# Patient Record
Sex: Male | Born: 1980 | Race: Black or African American | Hispanic: No | Marital: Single | Smoking: Current every day smoker
Health system: Southern US, Community
[De-identification: ages and names within clinical notes are randomized; demographics above are authoritative.]

## PROBLEM LIST (undated history)

## (undated) DIAGNOSIS — Z9119 Patient's noncompliance with other medical treatment and regimen: Secondary | ICD-10-CM

## (undated) DIAGNOSIS — Z91199 Patient's noncompliance with other medical treatment and regimen due to unspecified reason: Secondary | ICD-10-CM

## (undated) DIAGNOSIS — E109 Type 1 diabetes mellitus without complications: Secondary | ICD-10-CM

## (undated) DIAGNOSIS — R030 Elevated blood-pressure reading, without diagnosis of hypertension: Secondary | ICD-10-CM

## (undated) DIAGNOSIS — N529 Male erectile dysfunction, unspecified: Secondary | ICD-10-CM

## (undated) HISTORY — DX: Type 1 diabetes mellitus without complications: E10.9

## (undated) HISTORY — DX: Elevated blood-pressure reading, without diagnosis of hypertension: R03.0

## (undated) HISTORY — DX: Male erectile dysfunction, unspecified: N52.9

---

## 2002-11-27 ENCOUNTER — Emergency Department (HOSPITAL_COMMUNITY): Admission: EM | Admit: 2002-11-27 | Discharge: 2002-11-27 | Payer: Self-pay | Admitting: Emergency Medicine

## 2002-12-01 ENCOUNTER — Emergency Department (HOSPITAL_COMMUNITY): Admission: EM | Admit: 2002-12-01 | Discharge: 2002-12-02 | Payer: Self-pay | Admitting: Emergency Medicine

## 2003-03-28 ENCOUNTER — Emergency Department (HOSPITAL_COMMUNITY): Admission: EM | Admit: 2003-03-28 | Discharge: 2003-03-28 | Payer: Self-pay | Admitting: Emergency Medicine

## 2003-03-28 ENCOUNTER — Encounter: Payer: Self-pay | Admitting: Emergency Medicine

## 2003-07-13 ENCOUNTER — Emergency Department (HOSPITAL_COMMUNITY): Admission: EM | Admit: 2003-07-13 | Discharge: 2003-07-14 | Payer: Self-pay | Admitting: Emergency Medicine

## 2003-10-13 ENCOUNTER — Emergency Department (HOSPITAL_COMMUNITY): Admission: EM | Admit: 2003-10-13 | Discharge: 2003-10-13 | Payer: Self-pay | Admitting: Emergency Medicine

## 2003-11-16 ENCOUNTER — Encounter: Admission: RE | Admit: 2003-11-16 | Discharge: 2004-02-14 | Payer: Self-pay | Admitting: Geriatric Medicine

## 2003-12-03 ENCOUNTER — Emergency Department (HOSPITAL_COMMUNITY): Admission: EM | Admit: 2003-12-03 | Discharge: 2003-12-03 | Payer: Self-pay | Admitting: Emergency Medicine

## 2004-04-29 ENCOUNTER — Emergency Department (HOSPITAL_COMMUNITY): Admission: EM | Admit: 2004-04-29 | Discharge: 2004-04-29 | Payer: Self-pay | Admitting: Emergency Medicine

## 2005-01-12 ENCOUNTER — Emergency Department (HOSPITAL_COMMUNITY): Admission: EM | Admit: 2005-01-12 | Discharge: 2005-01-12 | Payer: Self-pay | Admitting: Emergency Medicine

## 2006-02-17 ENCOUNTER — Emergency Department (HOSPITAL_COMMUNITY): Admission: EM | Admit: 2006-02-17 | Discharge: 2006-02-17 | Payer: Self-pay | Admitting: Emergency Medicine

## 2006-06-07 ENCOUNTER — Emergency Department (HOSPITAL_COMMUNITY): Admission: EM | Admit: 2006-06-07 | Discharge: 2006-06-07 | Payer: Self-pay | Admitting: Emergency Medicine

## 2007-04-18 ENCOUNTER — Emergency Department (HOSPITAL_COMMUNITY): Admission: EM | Admit: 2007-04-18 | Discharge: 2007-04-19 | Payer: Self-pay | Admitting: Emergency Medicine

## 2007-05-10 ENCOUNTER — Emergency Department (HOSPITAL_COMMUNITY): Admission: EM | Admit: 2007-05-10 | Discharge: 2007-05-10 | Payer: Self-pay | Admitting: Emergency Medicine

## 2007-12-31 ENCOUNTER — Emergency Department (HOSPITAL_COMMUNITY): Admission: EM | Admit: 2007-12-31 | Discharge: 2007-12-31 | Payer: Self-pay | Admitting: Emergency Medicine

## 2008-07-06 ENCOUNTER — Emergency Department (HOSPITAL_COMMUNITY): Admission: EM | Admit: 2008-07-06 | Discharge: 2008-07-06 | Payer: Self-pay | Admitting: Emergency Medicine

## 2008-10-06 ENCOUNTER — Emergency Department (HOSPITAL_COMMUNITY): Admission: EM | Admit: 2008-10-06 | Discharge: 2008-10-06 | Payer: Self-pay | Admitting: Emergency Medicine

## 2009-03-01 ENCOUNTER — Emergency Department (HOSPITAL_COMMUNITY): Admission: EM | Admit: 2009-03-01 | Discharge: 2009-03-01 | Payer: Self-pay | Admitting: Family Medicine

## 2009-03-04 ENCOUNTER — Emergency Department (HOSPITAL_COMMUNITY): Admission: EM | Admit: 2009-03-04 | Discharge: 2009-03-04 | Payer: Self-pay | Admitting: Family Medicine

## 2009-03-20 ENCOUNTER — Ambulatory Visit: Payer: Self-pay | Admitting: Internal Medicine

## 2009-03-20 DIAGNOSIS — N529 Male erectile dysfunction, unspecified: Secondary | ICD-10-CM

## 2009-03-20 DIAGNOSIS — E109 Type 1 diabetes mellitus without complications: Secondary | ICD-10-CM

## 2009-03-20 DIAGNOSIS — R030 Elevated blood-pressure reading, without diagnosis of hypertension: Secondary | ICD-10-CM | POA: Insufficient documentation

## 2009-03-20 HISTORY — DX: Male erectile dysfunction, unspecified: N52.9

## 2009-03-20 HISTORY — DX: Type 1 diabetes mellitus without complications: E10.9

## 2009-03-20 HISTORY — DX: Elevated blood-pressure reading, without diagnosis of hypertension: R03.0

## 2009-03-21 ENCOUNTER — Encounter: Payer: Self-pay | Admitting: Internal Medicine

## 2009-03-21 LAB — CONVERTED CEMR LAB
AST: 17 units/L (ref 0–37)
Albumin: 4.2 g/dL (ref 3.5–5.2)
BUN: 16 mg/dL (ref 6–23)
CO2: 29 meq/L (ref 19–32)
Calcium: 9.5 mg/dL (ref 8.4–10.5)
Chloride: 106 meq/L (ref 96–112)
Creatinine, Ser: 0.9 mg/dL (ref 0.4–1.5)
Eosinophils Absolute: 0.1 10*3/uL (ref 0.0–0.7)
GFR calc non Af Amer: 131.44 mL/min (ref >60)
Glucose, Bld: 230 mg/dL — ABNORMAL HIGH (ref 70–99)
Ketones, ur: NEGATIVE mg/dL
LDL Cholesterol: 77 mg/dL (ref 0–99)
Leukocytes, UA: NEGATIVE
Lymphocytes Relative: 32.4 % (ref 12.0–46.0)
MCHC: 34.5 g/dL (ref 30.0–36.0)
Microalb, Ur: 0.9 mg/dL (ref 0.0–1.9)
Monocytes Absolute: 0.7 10*3/uL (ref 0.1–1.0)
Neutro Abs: 5.4 10*3/uL (ref 1.4–7.7)
Neutrophils Relative %: 58.6 % (ref 43.0–77.0)
Platelets: 246 10*3/uL (ref 150.0–400.0)
Potassium: 4.2 meq/L (ref 3.5–5.1)
RBC: 5.92 M/uL — ABNORMAL HIGH (ref 4.22–5.81)
RDW: 12.1 % (ref 11.5–14.6)
Sodium: 141 meq/L (ref 135–145)
Specific Gravity, Urine: >=1.03 (ref 1.000–1.030)
Testosterone: 368.87 ng/dL (ref 350.00–890.00)
Total Bilirubin: 1.1 mg/dL (ref 0.3–1.2)
Urine Glucose: 100 mg/dL
Urobilinogen, UA: 2 (ref 0.0–1.0)
VLDL: 11.2 mg/dL (ref 0.0–40.0)

## 2010-03-15 ENCOUNTER — Emergency Department (HOSPITAL_COMMUNITY): Admission: EM | Admit: 2010-03-15 | Discharge: 2010-03-16 | Payer: Self-pay | Admitting: Emergency Medicine

## 2010-08-11 IMAGING — CR DG CHEST 2V
2 series · 2 of 2 positions shown · non-contrast
Comparison: 02/17/2006

CLINICAL DATA: Hyperglycemia

CHEST - 2 VIEW

[w chest pa]
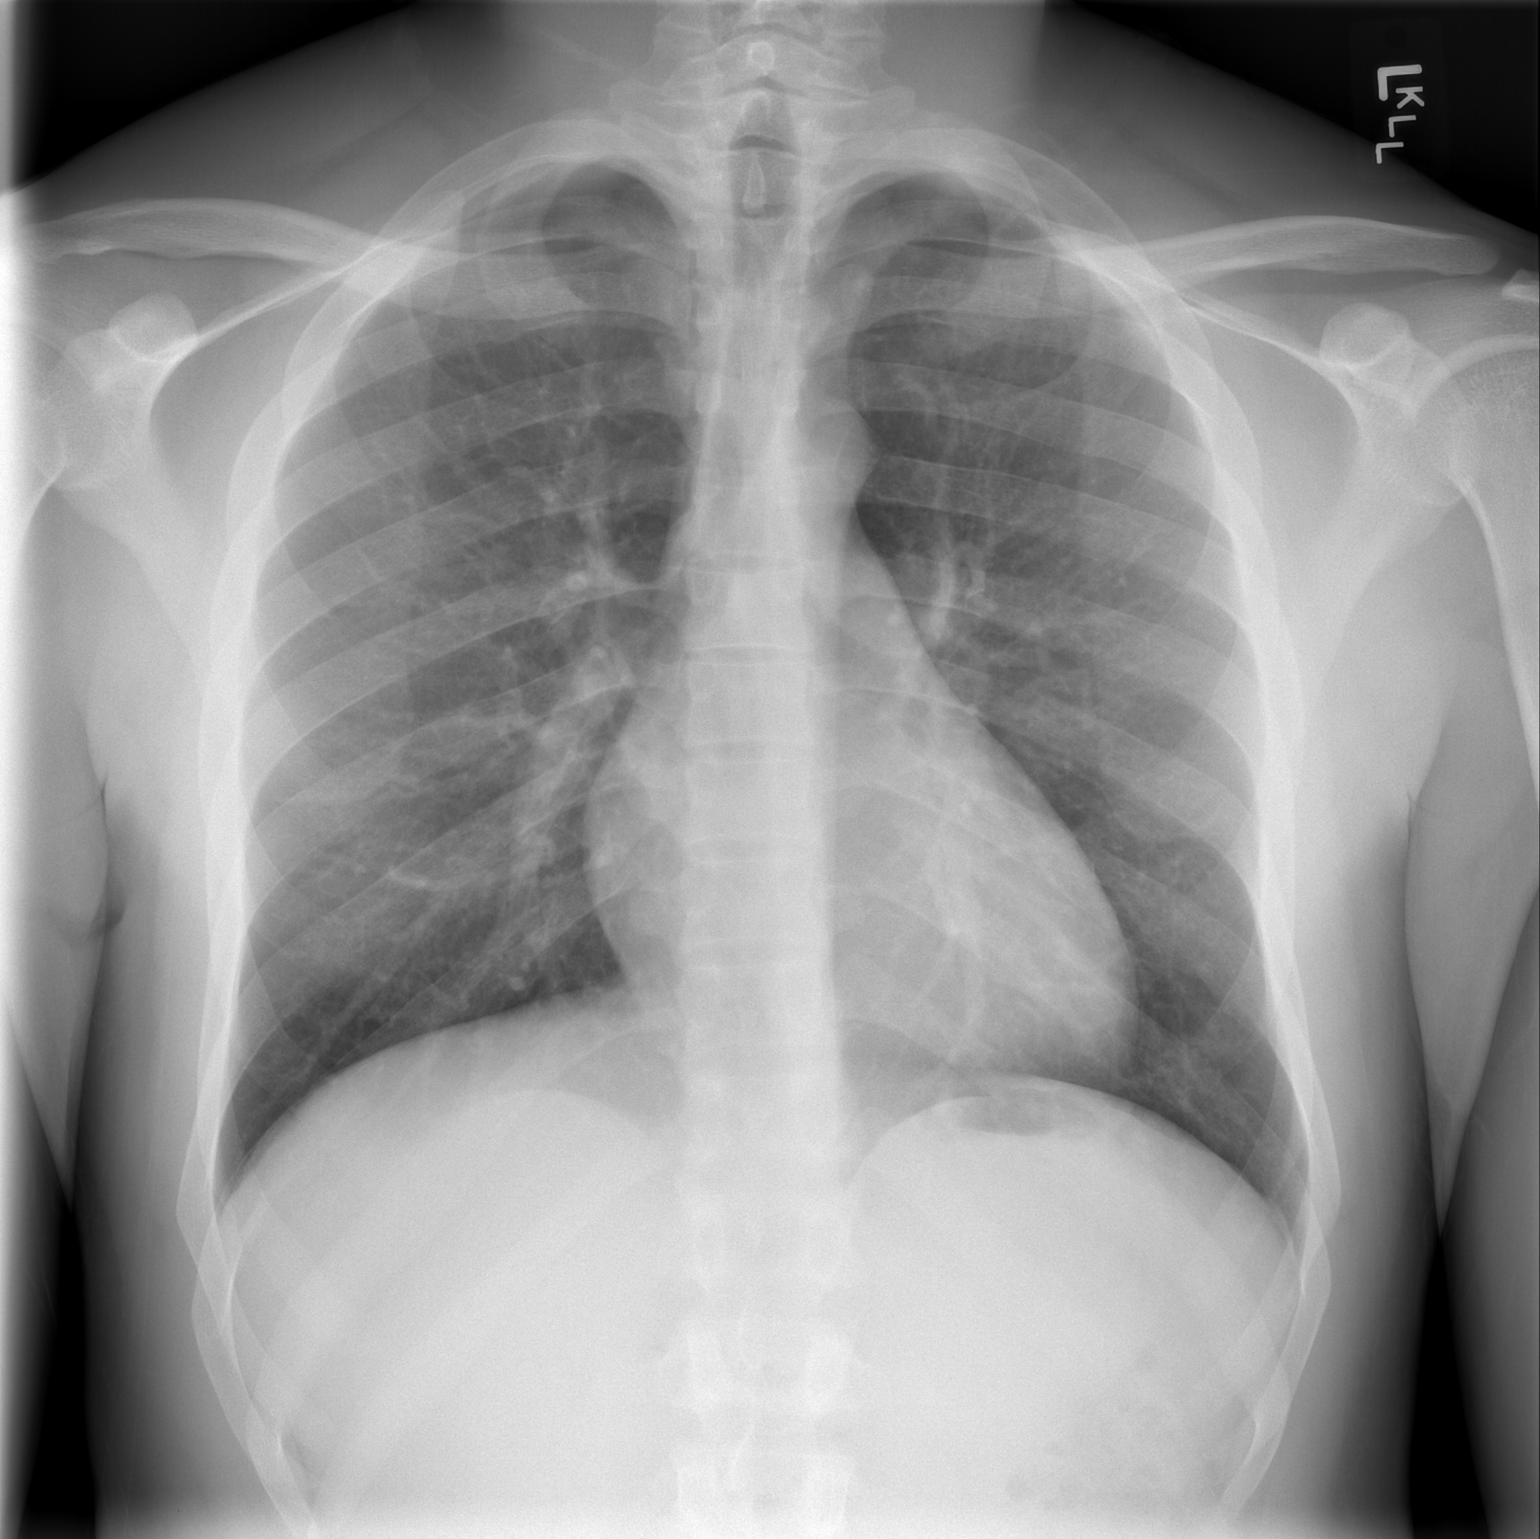

[w chest lat]
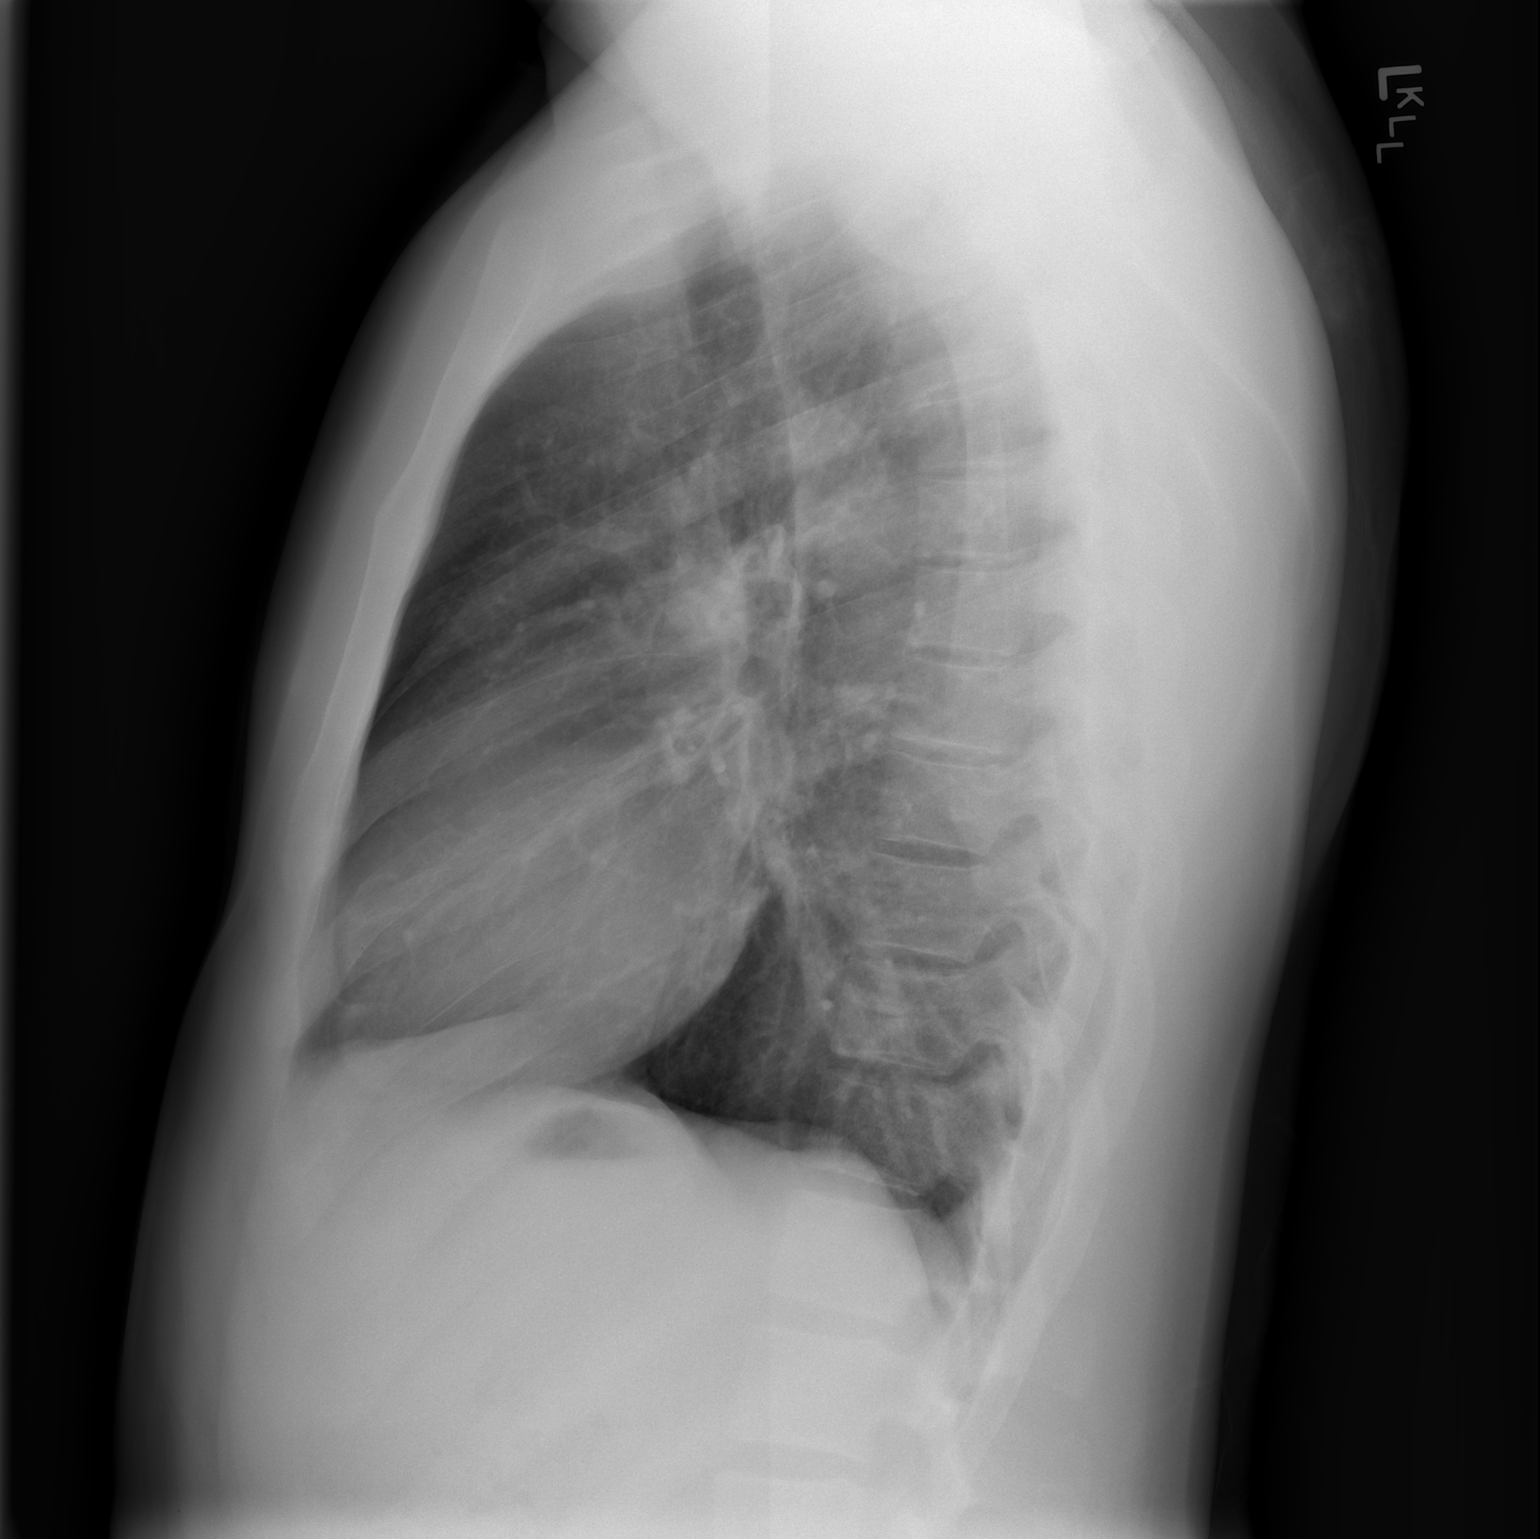

[2 of 2 positions shown; findings below may reference images not displayed]

FINDINGS: Cardiomediastinal silhouette is stable.  No acute
infiltrate or pleural effusion.  No pulmonary edema.  Bony thorax
is stable.
IMPRESSION: No active disease.

## 2010-08-24 ENCOUNTER — Ambulatory Visit
Admission: RE | Admit: 2010-08-24 | Discharge: 2010-08-24 | Payer: Self-pay | Source: Home / Self Care | Attending: Endocrinology | Admitting: Endocrinology

## 2010-08-24 ENCOUNTER — Other Ambulatory Visit: Payer: Self-pay | Admitting: Endocrinology

## 2010-08-24 DIAGNOSIS — R9389 Abnormal findings on diagnostic imaging of other specified body structures: Secondary | ICD-10-CM | POA: Insufficient documentation

## 2010-08-24 LAB — LIPID PANEL
Cholesterol: 145 mg/dL (ref 0–200)
HDL: 34.5 mg/dL — ABNORMAL LOW (ref >39.00)
LDL Cholesterol: 101 mg/dL — ABNORMAL HIGH (ref 0–99)
Total CHOL/HDL Ratio: 4
Triglycerides: 47 mg/dL (ref 0.0–149.0)
VLDL: 9.4 mg/dL (ref 0.0–40.0)

## 2010-08-24 LAB — BASIC METABOLIC PANEL
BUN: 14 mg/dL (ref 6–23)
CO2: 29 mEq/L (ref 19–32)
Calcium: 9.6 mg/dL (ref 8.4–10.5)
Chloride: 100 mEq/L (ref 96–112)
Creatinine, Ser: 1.1 mg/dL (ref 0.4–1.5)
GFR: 107.63 mL/min (ref >60.00)
Glucose, Bld: 276 mg/dL — ABNORMAL HIGH (ref 70–99)
Potassium: 4.4 mEq/L (ref 3.5–5.1)
Sodium: 138 mEq/L (ref 135–145)

## 2010-08-24 LAB — MICROALBUMIN / CREATININE URINE RATIO
Creatinine,U: 194.9 mg/dL
Microalb Creat Ratio: 0.3 mg/g (ref 0.0–30.0)
Microalb, Ur: 0.5 mg/dL (ref 0.0–1.9)

## 2010-08-24 LAB — TSH: TSH: 0.77 u[IU]/mL (ref 0.35–5.50)

## 2010-08-24 LAB — HEMOGLOBIN A1C: Hgb A1c MFr Bld: 11.1 % — ABNORMAL HIGH (ref 4.6–6.5)

## 2010-09-07 ENCOUNTER — Ambulatory Visit
Admission: RE | Admit: 2010-09-07 | Discharge: 2010-09-07 | Payer: Self-pay | Source: Home / Self Care | Attending: Endocrinology | Admitting: Endocrinology

## 2010-09-07 LAB — CONVERTED CEMR LAB: Blood Glucose, Fingerstick: 231

## 2010-09-12 ENCOUNTER — Encounter: Payer: Self-pay | Admitting: Endocrinology

## 2010-09-13 NOTE — Assessment & Plan Note (Signed)
Summary: NEW ENDO/DM/BCBS/LB   Vital Signs:  Patient profile:   30 year old male Height:      71 inches (180.34 cm) Weight:      215.50 pounds (97.95 kg) BMI:     30.16 O2 Sat:      97 % on Room air Temp:     98.5 degrees F (36.94 degrees C) oral Pulse rate:   80 / minute Pulse rhythm:   regular BP sitting:   130 / 86  (right arm) Cuff size:   regular  Vitals Entered By: Brenton Grills CMA Duncan Dull) (August 24, 2010 10:18 AM)  O2 Flow:  Room air CC: New Endo Consult/DMI/Dr. John/aj Is Patient Diabetic? Yes   CC:  New Endo Consult/DMI/Dr. John/aj.  History of Present Illness: pt states 9 years h/o dm.  he is unaware of any chronic complications.  he has been on insulin since dx.  he takes lantus and humalog. he says his cbg's vary widely.  he takes a widely varying dosage of humalog (averages 40 units once daily). pt says his diet and exercise are "not good."   symptomatically, pt states 6 mos of intermittent painful ulcers on the penis, and assoc itching.     Current Medications (verified): 1)  Lantus 100 Unit/ml Soln (Insulin Glargine) .... Inject 18 Units Subq Once Daily 2)  Humalog Kwikpen 100 Unit/ml Soln (Insulin Lispro (Human)) .... Use Asd 8 Units Three Times A Day/ac 3)  Cialis 20 Mg Tabs (Tadalafil) .Marland Kitchen.. 1po Once Daily 4)  Chantix Starting Month Pak 0.5 Mg X 11 & 1 Mg X 42 Tabs (Varenicline Tartrate) .... Use Asd 1 By Mouth Once Daily 5)  Chantix Continuing Month Pak 1 Mg Tabs (Varenicline Tartrate) .... Use Asd 1 By Mouth Once Daily 6)  Onetouch Ultra Test  Strp (Glucose Blood) .... Use 1 Strip Once Daily To Test Blood Glucose 7)  Lancets  Misc (Lancets) .... Use 1 Lancet To Test Blood Glucose Once Daily 8)  Bd Disp Needles 27g X 1/2" Misc (Needle (Disp)) .... Use 1 Needle To Draw Insulin 9)  Pen Needles 5/16" 31g X 8 Mm Misc (Insulin Pen Needle) .... Use 1 With Kwik Pen To Inject Insulin  Allergies (verified): No Known Drug Allergies  Past History:  Past  Medical History: Last updated: 03/20/2009 Diabetes mellitus, type I since 30yo E.D.   Social History: Reviewed history from 03/20/2009 and no changes required. 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  he has lost a few lbs recently.  he has intermittent headache, night sweats, easy bruising, and polyuria.  Physical Exam  General:  normal appearance.   Head:  head: no deformity eyes: no periorbital swelling, no proptosis external nose and ears are normal mouth: no lesion seen Neck:  Supple without thyroid enlargement or tenderness.  Lungs:  few exp wheezes. Heart:  Regular rate and rhythm without murmurs or gallops noted. Normal S1,S2.   Abdomen:  abdomen is soft, nontender.  no hepatosplenomegaly.   not distended.  no hernia  Genitalia:  Normal external male genitalia with no urethral discharge.  there is no ulcer now, but he has multiple tiny nodules on the head of the penis Msk:  muscle bulk and strength are grossly normal.  no obvious joint  swelling.  gait is normal and steady  Pulses:  dorsalis pedis intact bilat.  no carotid bruit  Extremities:  no deformity.  no ulcer on the feet.  feet are of normal color and temp.  no edema  Neurologic:  cn 2-12 grossly intact.   readily moves all 4's.   sensation is intact to touch on the feet  Skin:  normal texture and temp.  no rash.  not diaphoretic  Cervical Nodes:  No significant adenopathy.  Psych:  Alert and cooperative; normal mood and affect; normal attention span and concentration.   Additional Exam:  LDL Cholesterol      [H]  119 mg/dL  Hemoglobin J4N       [H]  11.1 %    Impression & Recommendations:  Problem # 1:  DIABETES MELLITUS, TYPE I (ICD-250.01) needs increased rx.  he may need a simpler  schedule  Problem # 2:  hyperlipidemia in view of #1, he should take medication  Problem # 3:  NONSPECIFIC ABN FINDING RAD & OTH EXAM GU ORGAN (ICD-793.5) i do not see ulcers now.  Medications Added to Medication List This Visit: 1)  Valacyclovir Hcl 1 Gm Tabs (Valacyclovir hcl) .Marland Kitchen.. 1 tab once daily 2)  Chantix Starting Month Pak 0.5 Mg X 11 & 1 Mg X 42 Tabs (Varenicline tartrate) .... Take two times a day.  refill with continuing packs 3)  Levemir Flexpen 100 Unit/ml Soln (Insulin detemir) .... 70 units each am, and pen needles three times a day 4)  Novolog Flexpen 100 Unit/ml Soln (Insulin aspart) .... Take as directed  Other Orders: Urology Referral (Urology) TLB-Lipid Panel (80061-LIPID) TLB-TSH (Thyroid Stimulating Hormone) (84443-TSH) TLB-BMP (Basic Metabolic Panel-BMET) (80048-METABOL) TLB-A1C / Hgb A1C (Glycohemoglobin) (83036-A1C) TLB-Microalbumin/Creat Ratio, Urine (82043-MALB) Consultation Level IV (82956)  Patient Instructions: 1)  good diet and exercise habits significanly improve the control of your diabetes.  please let me know if you wish to be referred to a dietician.  high blood sugar is very risky to your health.  you should see an eye doctor every year. 2)  controlling your blood pressure and cholesterol drastically reduces the damage diabetes does to your body.  this also applies to quitting smoking.  please discuss these with your doctor.  you should take an aspirin every day, unless you have been advised by a doctor not to. 3)  check your blood sugar 2 times a day.  vary the time of day when you check, between before the 3 meals, and at bedtime.  also check if you have symptoms of your blood sugar being too high or too low.  please keep a record of the readings and bring it to your next appointment here.  please call us sooner if you are having low blood sugar episodes. 4)  i have sent prescriptions for valtrex and chantix, to your pharmacy. 5)  blood tests are  being ordered for you today.  please call [phone] to hear your test results. 6)  we will need to take this complex situation in stages 7)  for now, change lantus to levemir, 70 units each am. 8)  change humalog to novolog, 5 units any time your blood sugar is above 250. 9)  Please schedule a follow-up appointment in 2 weeks. 10)  refer to [facility].  you will be called with a day and time for an appointment. 11)  (update: i left message on phone-tree:  rx as we discussed.  you should consider chol medication). Prescriptions:  NOVOLOG FLEXPEN 100 UNIT/ML SOLN (INSULIN ASPART) take as directed  #1 box x 3   Entered and Authorized by:   Minus Breeding MD   Signed by:   Minus Breeding MD on 08/24/2010   Method used:   [facility]        [facility] [address] [phone]* (retail)       [address]       [place], [place]  [address]       Ph: [phone] or [phone]       Fax: [phone]   RxID:   [id number] LEVEMIR FLEXPEN 100 UNIT/ML SOLN (INSULIN DETEMIR) 70 units each am, and pen needles three times a day  #2 boxes x 2   Entered and Authorized by:   Minus Breeding MD   Signed by:   Minus Breeding MD on 08/24/2010   Method used:   [facility]        [facility] [address] [phone]* (retail)       [address]       [place], [place]  [address]       Ph: [phone] or [phone]       Fax: [phone]   RxID:   [id number] CIALIS 20 MG TABS (TADALAFIL) 1po once daily  #5 x 11   Entered and Authorized by:   Minus Breeding MD   Signed by:   Minus Breeding MD on 08/24/2010   Method used:   [facility]ron[facility] to        [facility] [address] [phone]* (retail)       [address]       [place], [place]  [address]       Ph: [phone] or [phone]       Fax: [phone]   RxID:   [id number] CHANTIX STARTING MONTH PAK 0.5 MG X 11 & 1 MG X 42 TABS (VARENICLINE TARTRATE) take two times a day.   refill with continuing packs  #60 x 5   Entered and Authorized by:   Minus Breeding MD   Signed by:   Minus Breeding MD on 08/24/2010   Method used:   [facility]ronically to        [facility] [address] [phone]* (retail)       [address]       [place], [place]  [address]       Ph: [phone] or [phone]       Fax: [phone]   RxID:   [id number] VALACYCLOVIR HCL 1 GM TABS (VALACYCLOVIR HCL) 1 tab once daily  #30 x 11   Entered and Authorized by:   Minus Breeding MD   Signed by:   Minus Breeding MD on 08/24/2010   Method used:   Electronically to        [facility] [address] [phone]* (retail)       [address]       [place], [place]  [address]       Ph: [phone] or [phone]       Fax: [phone]   RxID:   [id number]    Orders Added: 1)  Urology Referral [Urology] 2)  TLB-Lipid Panel [80061-LIPID] 3)  TLB-TSH (Thyroid  Stimulating Hormone) [84443-TSH] 4)  TLB-BMP (Basic Metabolic Panel-BMET) [80048-METABOL] 5)  TLB-A1C / Hgb A1C (Glycohemoglobin) [83036-A1C] 6)  TLB-Microalbumin/Creat Ratio, Urine [82043-MALB] 7)  Consultation Level IV [16109]

## 2010-09-19 NOTE — Assessment & Plan Note (Signed)
Summary: 2 WK FU  STC   Vital Signs:  Patient profile:   30 year old male Height:      71 inches (180.34 cm) Weight:      220.50 pounds (100.23 kg) BMI:     30.86 O2 Sat:      97 % on Room air Temp:     98.2 degrees F (36.78 degrees C) oral Pulse rate:   81 / minute Pulse rhythm:   regular BP sitting:   110 / 76  (left arm) Cuff size:   regular  Vitals Entered By: Brenton Grills CMA Duncan Dull) (September 07, 2010 8:45 AM)  O2 Flow:  Room air CC: 2 week F/U/aj Is Patient Diabetic? Yes CBG Result 231   CC:  2 week F/U/aj.  History of Present Illness: no cbg record, but states cbg's are "improved."  he says levemir is too expensive.    Current Medications (verified): 1)  Cialis 20 Mg Tabs (Tadalafil) .Marland Kitchen.. 1po Once Daily 2)  Chantix Starting Month Pak 0.5 Mg X 11 & 1 Mg X 42 Tabs (Varenicline Tartrate) .... Use Asd 1 By Mouth Once Daily 3)  Chantix Continuing Month Pak 1 Mg Tabs (Varenicline Tartrate) .... Use Asd 1 By Mouth Once Daily 4)  Onetouch Ultra Test  Strp (Glucose Blood) .... Use 1 Strip Once Daily To Test Blood Glucose 5)  Lancets  Misc (Lancets) .... Use 1 Lancet To Test Blood Glucose Once Daily 6)  Bd Disp Needles 27g X 1/2" Misc (Needle (Disp)) .... Use 1 Needle To Draw Insulin 7)  Pen Needles 5/16" 31g X 8 Mm Misc (Insulin Pen Needle) .... Use 1 With Kwik Pen To Inject Insulin 8)  Valacyclovir Hcl 1 Gm Tabs (Valacyclovir Hcl) .Marland Kitchen.. 1 Tab Once Daily 9)  Chantix Starting Month Pak 0.5 Mg X 11 & 1 Mg X 42 Tabs (Varenicline Tartrate) .... Take Two Times A Day.  Refill With Continuing Packs 10)  Levemir Flexpen 100 Unit/ml Soln (Insulin Detemir) .... 70 Units Each Am, and Pen Needles Three Times A Day 11)  Novolog Flexpen 100 Unit/ml Soln (Insulin Aspart) .... Take As Directed  Allergies (verified): No Known Drug Allergies  Past History:  Past Medical History: Last updated: 03/20/2009 Diabetes mellitus, type I since 30yo E.D.   Review of Systems  The patient  denies hypoglycemia.    Physical Exam  General:  normal appearance.   Psych:  Alert and cooperative; normal mood and affect; normal attention span and concentration.     Impression & Recommendations:  Problem # 1:  DIABETES MELLITUS, TYPE I (ICD-250.01) therapy limited by noncompliance, and by cost factors.  i'll do the best i can.  Medications Added to Medication List This Visit: 1)  Humulin N 100 Unit/ml Susp (Insulin isophane human) .... 70 units each am, and syringes once daily  Other Orders: Glucose, (CBG) (16109) Est. Patient Level III (60454)  Patient Instructions: 1)  change levemir to nph insulin, 70 units each am. 2)  check your blood sugar 2 times a day.  vary the time of day when you check, between before the 3 meals, and at bedtime.  also check if you have symptoms of your blood sugar being too high or too low.  please keep a record of the readings and bring it to your next appointment here.  please call us sooner if you are having low blood sugar episodes. 3)  Please schedule a follow-up appointment in 2 weeks. Prescriptions: HUMULIN N 100 UNIT/ML  SUSP (INSULIN ISOPHANE HUMAN) 70 units each am, and syringes once daily  #3 vials x 11   Entered and Authorized by:   Minus Breeding MD   Signed by:   Minus Breeding MD on 09/07/2010   Method used:   Electronically to        [facility] [phone]* (retail)       [address]       [place], [place]  [address]       Ph: [phone]       Fax: [phone]   RxID:   [id number]    Orders Added: 1)  Glucose, (CBG) [82962] 2)  Est. Patient Level III [24401]    Laboratory Results   Blood Tests    Date/Time Reported: 09/07/2010 8:49am  CBG Random:: 231mg /dL

## 2010-09-24 ENCOUNTER — Ambulatory Visit: Payer: Self-pay | Admitting: Endocrinology

## 2010-09-27 NOTE — Consult Note (Signed)
Summary: [facility]   Imported By: Sherian Rein 09/19/2010 14:34:10  _____________________________________________________________________  External Attachment:    Type:   Image     Comment:   External Document

## 2010-10-09 ENCOUNTER — Encounter: Payer: Self-pay | Admitting: Endocrinology

## 2010-10-09 ENCOUNTER — Ambulatory Visit (INDEPENDENT_AMBULATORY_CARE_PROVIDER_SITE_OTHER): Payer: BC Managed Care – PPO | Admitting: Endocrinology

## 2010-10-09 DIAGNOSIS — E109 Type 1 diabetes mellitus without complications: Secondary | ICD-10-CM

## 2010-10-18 NOTE — Assessment & Plan Note (Signed)
Summary: fu/per md to complete forms/lb   Vital Signs:  Patient profile:   30 year old male Height:      71 inches Weight:      218.75 pounds BMI:     30.62 O2 Sat:      98 % on Room air Temp:     97.9 degrees F oral Pulse rate:   82 / minute BP sitting:   138 / 82  (left arm) Cuff size:   regular  Vitals Entered By: Margaret Pyle, CMA (October 09, 2010 8:29 AM)  O2 Flow:  Room air CC: Form to be filled out/DBD   CC:  Form to be filled out/DBD.  History of Present Illness: pt states he feels well in general.  no cbg record, but states cbg's vary from 30 (when he takes too much novolog), up to 600.  cbg is in general higher later in the day.   he takes a total of approx 35 units of novolog per day.  Allergies: No Known Drug Allergies  Past History:  Past Medical History: Last updated: 03/20/2009 Diabetes mellitus, type I since 30yo E.D.   Review of Systems  The patient denies syncope.    Physical Exam  General:  normal appearance.   Psych:  Alert and cooperative; normal mood and affect; normal attention span and concentration.     Impression & Recommendations:  Problem # 1:  DIABETES MELLITUS, TYPE I (ICD-250.01) needs increased rx, but the as needed novolog does not seem like a good option for him.    Medications Added to Medication List This Visit: 1)  Humulin N 100 Unit/ml Susp (Insulin isophane human) .... 90 units each am, and syringes once daily  Other Orders: Est. Patient Level III (24401)  Patient Instructions: 1)  increase nph insulin to 90 units each am. 2)  stop novolog. 3)  check your blood sugar 2 times a day.  vary the time of day when you check, between before the 3 meals, and at bedtime.  also check if you have symptoms of your blood sugar being too high or too low.  please keep a record of the readings and bring it to your next appointment here.  please call us sooner if you are having low blood sugar episodes. 4)  Please  schedule a follow-up appointment in 2 weeks.   Orders Added: 1)  Est. Patient Level III [02725]

## 2010-10-18 NOTE — Letter (Signed)
Summary: Physician statement/[employer]  Physician statement/[employer]   Imported By: Lester Bradley 10/10/2010 10:30:18  _____________________________________________________________________  External Attachment:    Type:   Image     Comment:   External Document

## 2010-10-23 ENCOUNTER — Ambulatory Visit: Payer: BC Managed Care – PPO | Admitting: Endocrinology

## 2010-10-23 DIAGNOSIS — Z0289 Encounter for other administrative examinations: Secondary | ICD-10-CM

## 2010-10-26 LAB — GLUCOSE, CAPILLARY: Glucose-Capillary: 289 mg/dL — ABNORMAL HIGH (ref 70–99)

## 2010-11-18 LAB — GLUCOSE, CAPILLARY: Glucose-Capillary: 285 mg/dL — ABNORMAL HIGH (ref 70–99)

## 2010-11-27 LAB — HERPES SIMPLEX VIRUS CULTURE: Culture: NOT DETECTED

## 2010-11-27 LAB — GLUCOSE, CAPILLARY: Glucose-Capillary: 276 mg/dL — ABNORMAL HIGH (ref 70–99)

## 2011-01-28 ENCOUNTER — Telehealth: Payer: Self-pay

## 2011-01-28 NOTE — Telephone Encounter (Signed)
Please note policy about samples. If pt cannot come in for ov, what i can do is refill current meds once only.  No further refill without ov.

## 2011-01-28 NOTE — Telephone Encounter (Signed)
Pt came into clinic requesting samples of Novolog insulin. Per last office visit in Feb 2012 pt was advised to stop Novolog and start Humalin N 90 units qam and follow up in 2 weeks. Pt says he has since lost his Insurance and cannot afford OV or Humalin N at this time. Pt is requesting insulin change back and sample, please advise.

## 2011-01-29 ENCOUNTER — Ambulatory Visit (INDEPENDENT_AMBULATORY_CARE_PROVIDER_SITE_OTHER): Payer: Self-pay | Admitting: Endocrinology

## 2011-01-29 ENCOUNTER — Encounter: Payer: Self-pay | Admitting: Endocrinology

## 2011-01-29 DIAGNOSIS — E109 Type 1 diabetes mellitus without complications: Secondary | ICD-10-CM

## 2011-01-29 MED ORDER — INSULIN ASPART 100 UNIT/ML ~~LOC~~ SOLN: 10.0000 [IU] | Freq: Three times a day (TID) | SUBCUTANEOUS | Status: DC

## 2011-01-29 NOTE — Progress Notes (Signed)
  Subjective:    Patient ID: Jake Duncan, male    DOB: [birth year], 30 y.o.   MRN: [id number]  HPI Pt says he currently does not have health insurance, but he will regain it in 4-6 weeks.  He says he has neither a glucose meter, not strips.  He denies n/v/sob.   He ahs been taking his insulin intermittently. Past Medical History  Diagnosis Date  . DIABETES MELLITUS, TYPE I 03/20/2009  . 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 ELEVATED BLOOD PRESSURE WITHOUT DIAGNOSIS OF HYPERTENSION 03/20/2009    No past surgical history on file.  History   Social History  . Marital Status: Single    Spouse Name: N/A    Number of Children: 0  . Years of Education: N/A   Occupational History  . TRUCK DRIVER    Social History Main Topics  . Smoking status: Current Everyday Smoker  . Smokeless tobacco: Not on file  . Alcohol Use: Yes     rare  . Drug Use: Not on file  . Sexually Active: Not on file   Other Topics Concern  . 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    No Known Allergies  Family History  Problem Relation Age of Onset  . Diabetes Other     Grandfather    BP 122/70  Pulse 78  Temp(Src) 98.7 F (37.1 C) (Oral)  Ht 6' (1.829 m)  Wt 218 lb 9.6 oz (99.156 kg)  BMI 29.65 kg/m2  SpO2 97%  Review of Systems denies hypoglycemia    Objective:   Physical Exam Pulses: dorsalis pedis intact bilat.   Feet: no deformity.  no ulcer on the feet.  feet are of normal color and temp.  no edema Neuro: sensation is intact to touch on the feet    Assessment & Plan:  Dm, therapy limited by pt's lack of health insurance

## 2011-01-29 NOTE — Patient Instructions (Addendum)
Here are a new meter, strips, syringes, and insulin Please return here when you regain your insurance. Also, here are some samples of "levemir," which is somewhat similar, but not the same as the nph.

## 2011-01-29 NOTE — Telephone Encounter (Signed)
Pt advised and transferred to discuss cash pay options for OV

## 2011-05-08 LAB — COMPREHENSIVE METABOLIC PANEL
ALT: 16
AST: 17
CO2: 27
Creatinine, Ser: 1.1
Potassium: 4.4
Total Bilirubin: 0.7
Total Protein: 6.4

## 2011-05-08 LAB — URINALYSIS, ROUTINE W REFLEX MICROSCOPIC
Bilirubin Urine: NEGATIVE
Glucose, UA: >1000 — AB
Hgb urine dipstick: NEGATIVE
Ketones, ur: NEGATIVE
Specific Gravity, Urine: 1.036 — ABNORMAL HIGH
Urobilinogen, UA: 0.2

## 2011-05-08 LAB — URINE MICROSCOPIC-ADD ON

## 2011-05-14 LAB — BASIC METABOLIC PANEL
BUN: 13
Calcium: 9.3
Creatinine, Ser: 1.01
GFR calc Af Amer: >60
Glucose, Bld: 282 — ABNORMAL HIGH

## 2011-05-14 LAB — URINE MICROSCOPIC-ADD ON

## 2011-05-14 LAB — URINALYSIS, ROUTINE W REFLEX MICROSCOPIC
Glucose, UA: >1000 — AB
Hgb urine dipstick: NEGATIVE
Ketones, ur: NEGATIVE
Leukocytes, UA: NEGATIVE
Nitrite: NEGATIVE
Protein, ur: NEGATIVE

## 2011-05-14 LAB — GLUCOSE, CAPILLARY

## 2011-05-24 LAB — URINALYSIS, ROUTINE W REFLEX MICROSCOPIC
Glucose, UA: >1000 — AB
Hgb urine dipstick: NEGATIVE
Ketones, ur: NEGATIVE
Specific Gravity, Urine: 1.036 — ABNORMAL HIGH
Urobilinogen, UA: 1

## 2011-05-24 LAB — CBC
Hemoglobin: 16.6
MCV: 87.4
RBC: 5.53
RDW: 13.6
WBC: 13 — ABNORMAL HIGH

## 2011-05-24 LAB — DIFFERENTIAL
Basophils Relative: 1
Lymphocytes Relative: 21
Monocytes Relative: 5
Neutro Abs: 9.4 — ABNORMAL HIGH
Neutrophils Relative %: 73

## 2011-05-24 LAB — BASIC METABOLIC PANEL
GFR calc Af Amer: >60
GFR calc non Af Amer: >60
Sodium: 138

## 2011-05-24 LAB — ETHANOL: Alcohol, Ethyl (B): 93 — ABNORMAL HIGH

## 2011-07-15 ENCOUNTER — Ambulatory Visit: Payer: Self-pay | Admitting: Endocrinology

## 2011-07-15 ENCOUNTER — Telehealth: Payer: Self-pay | Admitting: *Deleted

## 2011-07-15 DIAGNOSIS — E109 Type 1 diabetes mellitus without complications: Secondary | ICD-10-CM

## 2011-07-15 DIAGNOSIS — Z0289 Encounter for other administrative examinations: Secondary | ICD-10-CM

## 2011-07-15 NOTE — Telephone Encounter (Signed)
Pt is requesting referral to an endocrinologist in [place]

## 2011-07-15 NOTE — Telephone Encounter (Signed)
Done per emr 

## 2011-09-30 ENCOUNTER — Other Ambulatory Visit: Payer: Self-pay | Admitting: Endocrinology

## 2012-04-05 ENCOUNTER — Emergency Department (HOSPITAL_BASED_OUTPATIENT_CLINIC_OR_DEPARTMENT_OTHER)
Admission: EM | Admit: 2012-04-05 | Discharge: 2012-04-06 | Disposition: A | Discharge status: Home / Self Care | Payer: BC Managed Care – PPO | Attending: Emergency Medicine | Admitting: Emergency Medicine

## 2012-04-05 ENCOUNTER — Encounter (HOSPITAL_BASED_OUTPATIENT_CLINIC_OR_DEPARTMENT_OTHER): Payer: Self-pay | Admitting: *Deleted

## 2012-04-05 DIAGNOSIS — S335XXA Sprain of ligaments of lumbar spine, initial encounter: Secondary | ICD-10-CM | POA: Insufficient documentation

## 2012-04-05 DIAGNOSIS — R209 Unspecified disturbances of skin sensation: Secondary | ICD-10-CM | POA: Insufficient documentation

## 2012-04-05 DIAGNOSIS — E119 Type 2 diabetes mellitus without complications: Secondary | ICD-10-CM | POA: Insufficient documentation

## 2012-04-05 DIAGNOSIS — Z794 Long term (current) use of insulin: Secondary | ICD-10-CM | POA: Insufficient documentation

## 2012-04-05 DIAGNOSIS — S39012A Strain of muscle, fascia and tendon of lower back, initial encounter: Secondary | ICD-10-CM

## 2012-04-05 DIAGNOSIS — M542 Cervicalgia: Secondary | ICD-10-CM | POA: Insufficient documentation

## 2012-04-05 MED ORDER — TRAMADOL HCL 50 MG PO TABS
50.0000 mg | ORAL_TABLET | Freq: Once | ORAL | Status: AC
  Administered 2012-04-06: 50 mg via ORAL
  Filled 2012-04-05: qty 1

## 2012-04-05 MED ORDER — SODIUM CHLORIDE 0.9 % IV BOLUS (SEPSIS)
1000.0000 mL | Freq: Once | INTRAVENOUS | Status: AC
  Administered 2012-04-06: 1000 mL via INTRAVENOUS
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
## 2012-04-06 ENCOUNTER — Emergency Department (HOSPITAL_BASED_OUTPATIENT_CLINIC_OR_DEPARTMENT_OTHER): Payer: BC Managed Care – PPO

## 2012-04-06 LAB — CBC WITH DIFFERENTIAL/PLATELET
Basophils Absolute: 0 10*3/uL (ref 0.0–0.1)
Basophils Relative: 0 % (ref 0–1)
Eosinophils Absolute: 0.2 10*3/uL (ref 0.0–0.7)
Eosinophils Relative: 1 % (ref 0–5)
MCH: 30.6 pg (ref 26.0–34.0)
MCHC: 37.3 g/dL — ABNORMAL HIGH (ref 30.0–36.0)
MCV: 82.1 fL (ref 78.0–100.0)
Platelets: 246 10*3/uL (ref 150–400)
RDW: 12.5 % (ref 11.5–15.5)
WBC: 14.4 10*3/uL — ABNORMAL HIGH (ref 4.0–10.5)

## 2012-04-06 LAB — BASIC METABOLIC PANEL
Calcium: 10 mg/dL (ref 8.4–10.5)
GFR calc non Af Amer: >90 mL/min (ref >90)
Sodium: 140 mEq/L (ref 135–145)

## 2012-04-06 MED ORDER — METHOCARBAMOL 500 MG PO TABS: 500.0000 mg | ORAL_TABLET | Freq: Two times a day (BID) | ORAL | Status: AC

## 2012-04-06 MED ORDER — HYDROCODONE-ACETAMINOPHEN 5-325 MG PO TABS: 1.0000 | ORAL_TABLET | Freq: Four times a day (QID) | ORAL | Status: AC | PRN

## 2012-09-21 ENCOUNTER — Emergency Department (HOSPITAL_COMMUNITY): Payer: Self-pay

## 2012-09-21 ENCOUNTER — Emergency Department (HOSPITAL_COMMUNITY)
Admission: EM | Admit: 2012-09-21 | Discharge: 2012-09-22 | Disposition: A | Discharge status: Home / Self Care | Payer: Self-pay | Attending: Emergency Medicine | Admitting: Emergency Medicine

## 2012-09-21 ENCOUNTER — Encounter (HOSPITAL_COMMUNITY): Payer: Self-pay | Admitting: Emergency Medicine

## 2012-09-21 DIAGNOSIS — Z794 Long term (current) use of insulin: Secondary | ICD-10-CM | POA: Insufficient documentation

## 2012-09-21 DIAGNOSIS — F17200 Nicotine dependence, unspecified, uncomplicated: Secondary | ICD-10-CM | POA: Insufficient documentation

## 2012-09-21 DIAGNOSIS — Y9389 Activity, other specified: Secondary | ICD-10-CM | POA: Insufficient documentation

## 2012-09-21 DIAGNOSIS — Z8679 Personal history of other diseases of the circulatory system: Secondary | ICD-10-CM | POA: Insufficient documentation

## 2012-09-21 DIAGNOSIS — R0781 Pleurodynia: Secondary | ICD-10-CM

## 2012-09-21 DIAGNOSIS — E109 Type 1 diabetes mellitus without complications: Secondary | ICD-10-CM | POA: Insufficient documentation

## 2012-09-21 DIAGNOSIS — Y929 Unspecified place or not applicable: Secondary | ICD-10-CM | POA: Insufficient documentation

## 2012-09-21 DIAGNOSIS — S298XXA Other specified injuries of thorax, initial encounter: Secondary | ICD-10-CM | POA: Insufficient documentation

## 2012-09-21 DIAGNOSIS — Z87448 Personal history of other diseases of urinary system: Secondary | ICD-10-CM | POA: Insufficient documentation

## 2012-09-21 MED ORDER — HYDROMORPHONE HCL PF 1 MG/ML IJ SOLN
1.0000 mg | Freq: Once | INTRAMUSCULAR | Status: DC
  Filled 2012-09-21: qty 1

## 2012-09-21 MED ORDER — ONDANSETRON 4 MG PO TBDP
4.0000 mg | ORAL_TABLET | Freq: Once | ORAL | Status: DC
  Filled 2012-09-21: qty 1

## 2012-09-21 MED ORDER — IBUPROFEN 800 MG PO TABS
800.0000 mg | ORAL_TABLET | Freq: Once | ORAL | Status: AC
  Administered 2012-09-21: 800 mg via ORAL
  Filled 2012-09-21: qty 1

## 2012-09-21 NOTE — ED Notes (Signed)
Pt sts he fell off horse on to right side yesterday.pt c/o pain on right side.Positve LOC witnessed. NO N/V

## 2012-09-21 NOTE — ED Provider Notes (Signed)
History     CSN: [id number]  Arrival date & time 09/21/12  2244   First MD Initiated Contact with Patient 09/21/12 2315      Chief Complaint  Patient presents with  . Flank Pain    (Consider location/radiation/quality/duration/timing/severity/associated sxs/prior treatment) HPI Hx per PT, R flank pain, feel off of his horse yesterday landing on his R side.  No LOC or neck pain, no head injury. Pain is sharp worse with deep breath, pain was not to bad last night, worse today. Pain is lower R ribs, he feels like there is swelling. No arm or leg injury. No bleeding. Pain is mod in severity.   Past Medical History  Diagnosis Date  . DIABETES MELLITUS, TYPE I 03/20/2009  . 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 ELEVATED BLOOD PRESSURE WITHOUT DIAGNOSIS OF HYPERTENSION 03/20/2009    History reviewed. No pertinent past surgical history.  Family History  Problem Relation Age of Onset  . Diabetes Other     Grandfather    History  Substance Use Topics  . Smoking status: Current Every Day Smoker -- 1.00 packs/day    Types: Cigarettes  . Smokeless tobacco: Not on file  . Alcohol Use: Yes     Comment: rare      Review of Systems  Constitutional: Negative for fever and chills.  HENT: Negative for neck pain and neck stiffness.   Eyes: Negative for pain.  Respiratory: Negative for shortness of breath.   Cardiovascular: Negative for chest pain.  Gastrointestinal: Negative for abdominal pain.  Genitourinary: Positive for flank pain. Negative for dysuria.  Musculoskeletal: Negative for back pain.  Skin: Negative for rash.  Neurological: Negative for headaches.  All other systems reviewed and are negative.    Allergies  Review of patient's allergies indicates no known allergies.  Home Medications   Current Outpatient Rx  Name  Route  Sig  Dispense  Refill  . insulin aspart (NOVOLOG FLEXPEN) 100 UNIT/ML injection   Subcutaneous   Inject 10 Units into the skin 3 (three)  times daily before meals.         . insulin glargine (LANTUS SOLOSTAR) 100 UNIT/ML injection   Subcutaneous   Inject 20 Units into the skin at bedtime.           BP 119/65  Pulse 105  Temp(Src) 98.2 F (36.8 C) (Oral)  Resp 20  Ht 6' (1.829 m)  SpO2 95%  Physical Exam  Constitutional: He is oriented to person, place, and time. He appears well-developed and well-nourished.  HENT:  Head: Normocephalic and atraumatic.  Eyes: EOM are normal. Pupils are equal, round, and reactive to light.  Neck: Neck supple.  No c spine tenderness  Cardiovascular: Normal rate, regular rhythm and intact distal pulses.   Pulmonary/Chest: Effort normal and breath sounds normal. No respiratory distress. He exhibits no tenderness.  Musculoskeletal: Normal range of motion. He exhibits no edema.  No t-l-s spine tenderness or deformity, no ext tenderness or deformity, distal n/v intact x 4  Neurological: He is alert and oriented to person, place, and time.  Skin: Skin is warm and dry.    ED Course  Procedures (including critical care time)  Results for orders placed during the hospital encounter of 09/21/12  GLUCOSE, CAPILLARY      Result Value Range   Glucose-Capillary 384 (*) 70 - 99 mg/dL   Comment 1 Notify RN    GLUCOSE, CAPILLARY      Result Value Range   Glucose-Capillary  282 (*) 70 - 99 mg/dL   Comment 1 Notify RN     Ct Abdomen Pelvis Wo Contrast  09/22/2012  *RADIOLOGY REPORT*  Clinical Data: Fall from horse.  Right flank pain.  Loss of consciousness.  CT ABDOMEN AND PELVIS WITHOUT CONTRAST  Technique:  Multidetector CT imaging of the abdomen and pelvis was performed following the standard protocol without intravenous contrast.  Comparison: None.  Findings: The visualized portion of the liver, spleen, pancreas, and adrenal glands appear unremarkable in noncontrast CT appearance.  The gallbladder is contracted. The kidneys appear unremarkable, as do the proximal ureters.  Urinary bladder  unremarkable.  No renal calculi observed.  No perihepatic or perisplenic ascites.  Appendix unremarkable.  No retroperitoneal hematoma.  No pelvic fracture observed.  No lumbar spine fracture or acute subluxation; no lumbar impingement identified.  IMPRESSION:  1.  No specific cause for the patient's right flank pain is identified.  Sensitivity for solid organ lacerations as reduced due to the lack of IV contrast.   Original Report Authenticated By: Gaylyn Rong, M.D.    Dg Chest 2 View  09/22/2012  *RADIOLOGY REPORT*  Clinical Data: Fall from horse.  Right chest pain.  CHEST - 2 VIEW  Comparison: None.  Findings: Cardiac and mediastinal contours appear normal.  The lungs appear clear.  No pleural effusion is identified.  IMPRESSION:  No significant abnormality identified.   Original Report Authenticated By: Gaylyn Rong, M.D.     Imaging and pain control. RN checked blood sugar.  IM morphine/ zofran, ice and motrin  3:07 AM pain improved. Blood sugar improving, stable for d/c home, plan outpatient follow up  MDM  R lower rib tenderness, no PTX or acute findings on CT scan  IVFs and insulin for elevated blood sugar  Condition improved.   VS and nursing notes reviewed      Sunnie Nielsen, MD 09/22/12 [phone]

## 2012-09-22 LAB — GLUCOSE, CAPILLARY: Glucose-Capillary: 384 mg/dL — ABNORMAL HIGH (ref 70–99)

## 2012-09-22 MED ORDER — ONDANSETRON HCL 4 MG/2ML IJ SOLN
4.0000 mg | Freq: Once | INTRAMUSCULAR | Status: AC
  Administered 2012-09-22: 4 mg via INTRAVENOUS
  Filled 2012-09-22: qty 2

## 2012-09-22 MED ORDER — ALBUTEROL SULFATE HFA 108 (90 BASE) MCG/ACT IN AERS
2.0000 | INHALATION_SPRAY | RESPIRATORY_TRACT | Status: DC
  Administered 2012-09-22: 2 via RESPIRATORY_TRACT
  Filled 2012-09-22: qty 6.7

## 2012-09-22 MED ORDER — SODIUM CHLORIDE 0.9 % IV BOLUS (SEPSIS)
1000.0000 mL | Freq: Once | INTRAVENOUS | Status: AC
  Administered 2012-09-22: 1000 mL via INTRAVENOUS

## 2012-09-22 MED ORDER — INSULIN ASPART 100 UNIT/ML ~~LOC~~ SOLN
6.0000 [IU] | Freq: Once | SUBCUTANEOUS | Status: AC
  Administered 2012-09-22: 02:00:00 via SUBCUTANEOUS
  Filled 2012-09-22: qty 1

## 2012-09-22 MED ORDER — MORPHINE SULFATE 4 MG/ML IJ SOLN
4.0000 mg | Freq: Once | INTRAMUSCULAR | Status: AC
  Administered 2012-09-22: 4 mg via INTRAVENOUS
  Filled 2012-09-22: qty 1

## 2012-09-22 MED ORDER — IBUPROFEN 800 MG PO TABS: 800.0000 mg | ORAL_TABLET | Freq: Three times a day (TID) | ORAL | Status: AC

## 2012-09-22 NOTE — ED Notes (Signed)
Pt. CBG 282. Notified RN, Sport and exercise psychologist. MD. Theodoro Kalata made aware.

## 2012-10-14 ENCOUNTER — Telehealth: Payer: Self-pay | Admitting: *Deleted

## 2012-10-14 NOTE — Telephone Encounter (Signed)
PATIENT /PHARMACY REQUEST REFILL ON VALACYCLOVIR. LAST OV 01/2011

## 2012-10-14 NOTE — Telephone Encounter (Signed)
Please direct this request to pcp dr Jonny Ruiz.

## 2012-10-14 NOTE — Telephone Encounter (Signed)
Faxed to Dr.J.John.

## 2012-10-19 ENCOUNTER — Other Ambulatory Visit: Payer: Self-pay | Admitting: *Deleted

## 2012-10-19 MED ORDER — VALACYCLOVIR HCL 1 G PO TABS: 1000.0000 mg | ORAL_TABLET | Freq: Two times a day (BID) | ORAL | Status: AC

## 2012-10-19 NOTE — Telephone Encounter (Signed)
Done erx 

## 2012-11-09 ENCOUNTER — Other Ambulatory Visit: Payer: Self-pay | Admitting: *Deleted

## 2012-11-09 NOTE — Telephone Encounter (Signed)
Ov needed to consider

## 2013-02-21 DIAGNOSIS — S139XXA Sprain of joints and ligaments of unspecified parts of neck, initial encounter: Secondary | ICD-10-CM | POA: Insufficient documentation

## 2013-02-21 DIAGNOSIS — F17200 Nicotine dependence, unspecified, uncomplicated: Secondary | ICD-10-CM | POA: Insufficient documentation

## 2013-02-21 DIAGNOSIS — D179 Benign lipomatous neoplasm, unspecified: Secondary | ICD-10-CM | POA: Insufficient documentation

## 2013-02-21 DIAGNOSIS — Y939 Activity, unspecified: Secondary | ICD-10-CM | POA: Insufficient documentation

## 2013-02-21 DIAGNOSIS — S239XXA Sprain of unspecified parts of thorax, initial encounter: Secondary | ICD-10-CM | POA: Insufficient documentation

## 2013-02-21 DIAGNOSIS — Y929 Unspecified place or not applicable: Secondary | ICD-10-CM | POA: Insufficient documentation

## 2013-02-21 DIAGNOSIS — E1169 Type 2 diabetes mellitus with other specified complication: Secondary | ICD-10-CM | POA: Insufficient documentation

## 2013-02-21 DIAGNOSIS — R222 Localized swelling, mass and lump, trunk: Secondary | ICD-10-CM | POA: Insufficient documentation

## 2013-02-21 DIAGNOSIS — I10 Essential (primary) hypertension: Secondary | ICD-10-CM | POA: Insufficient documentation

## 2013-02-21 DIAGNOSIS — Z794 Long term (current) use of insulin: Secondary | ICD-10-CM | POA: Insufficient documentation

## 2013-02-21 DIAGNOSIS — Z87448 Personal history of other diseases of urinary system: Secondary | ICD-10-CM | POA: Insufficient documentation

## 2013-02-21 DIAGNOSIS — IMO0002 Reserved for concepts with insufficient information to code with codable children: Secondary | ICD-10-CM | POA: Insufficient documentation

## 2013-02-22 ENCOUNTER — Emergency Department (HOSPITAL_BASED_OUTPATIENT_CLINIC_OR_DEPARTMENT_OTHER): Payer: Self-pay

## 2013-02-22 ENCOUNTER — Emergency Department (HOSPITAL_BASED_OUTPATIENT_CLINIC_OR_DEPARTMENT_OTHER)
Admission: EM | Admit: 2013-02-22 | Discharge: 2013-02-22 | Disposition: A | Discharge status: Home / Self Care | Payer: Self-pay | Attending: Emergency Medicine | Admitting: Emergency Medicine

## 2013-02-22 ENCOUNTER — Encounter (HOSPITAL_BASED_OUTPATIENT_CLINIC_OR_DEPARTMENT_OTHER): Payer: Self-pay | Admitting: *Deleted

## 2013-02-22 DIAGNOSIS — S233XXA Sprain of ligaments of thoracic spine, initial encounter: Secondary | ICD-10-CM

## 2013-02-22 DIAGNOSIS — R739 Hyperglycemia, unspecified: Secondary | ICD-10-CM

## 2013-02-22 DIAGNOSIS — S161XXA Strain of muscle, fascia and tendon at neck level, initial encounter: Secondary | ICD-10-CM

## 2013-02-22 DIAGNOSIS — D179 Benign lipomatous neoplasm, unspecified: Secondary | ICD-10-CM

## 2013-02-22 MED ORDER — HYDROCODONE-ACETAMINOPHEN 5-325 MG PO TABS: 1.0000 | ORAL_TABLET | Freq: Four times a day (QID) | ORAL | Status: AC | PRN

## 2013-02-22 MED ORDER — INSULIN ASPART 100 UNIT/ML ~~LOC~~ SOLN: 10.0000 [IU] | Freq: Three times a day (TID) | SUBCUTANEOUS | Status: AC

## 2013-02-22 MED ORDER — INSULIN GLARGINE 100 UNIT/ML ~~LOC~~ SOLN: 20.0000 [IU] | Freq: Every day | SUBCUTANEOUS | Status: AC
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
## 2013-02-22 NOTE — ED Notes (Signed)
Pt appears in no distress. Pt was sleeping upon entering the room to discharge patient.  Pt c collar was removed.  Pt ambulatory at discharge.

## 2013-02-22 NOTE — ED Provider Notes (Signed)
History    CSN: [id number] Arrival date & time 02/21/13  2359  First MD Initiated Contact with Patient 02/22/13 0116     Chief Complaint  Patient presents with  . Fall   (Consider location/radiation/quality/duration/timing/severity/associated sxs/prior Treatment) HPI Is a 32 year old male who fell off his horse yesterday afternoon about 3:30 PM. He landed on his upper back. He is here complaining of moderate to severe pain in his neck and upper back. He denies lower back pain. He denies chest pain, shortness of breath or abdominal pain. He denies extremity injury. Pain is worse with movement or palpation. He admits to being out of his insulin for the past week and was noted to be hyperglycemic on arrival. He has a mass on his right lower chest at about the mid axillary line. He states this has been present for a long time but would like it evaluated.  Past Medical History  Diagnosis Date  . DIABETES MELLITUS, TYPE I 03/20/2009  . 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 ELEVATED BLOOD PRESSURE WITHOUT DIAGNOSIS OF HYPERTENSION 03/20/2009   History reviewed. No pertinent past surgical history. Family History  Problem Relation Age of Onset  . Diabetes Other     Grandfather   History  Substance Use Topics  . Smoking status: Current Every Day Smoker -- 1.00 packs/day    Types: Cigarettes  . Smokeless tobacco: Not on file  . Alcohol Use: Yes     Comment: rare    Review of Systems  All other systems reviewed and are negative.    Allergies  Review of patient's allergies indicates no known allergies.  Home Medications   Current Outpatient Rx  Name  Route  Sig  Dispense  Refill  . insulin lispro (HUMALOG) 100 UNIT/ML injection   Subcutaneous   Inject into the skin 3 (three) times daily before meals.         Marland Kitchen ibuprofen (ADVIL,MOTRIN) 800 MG tablet   Oral   Take 1 tablet (800 mg total) by mouth 3 (three) times daily.   21 tablet   0   . insulin aspart (NOVOLOG  FLEXPEN) 100 UNIT/ML injection   Subcutaneous   Inject 10 Units into the skin 3 (three) times daily before meals.         . insulin glargine (LANTUS SOLOSTAR) 100 UNIT/ML injection   Subcutaneous   Inject 20 Units into the skin at bedtime.         . valACYclovir (VALTREX) 1000 MG tablet   Oral   Take 1 tablet (1,000 mg total) by mouth 2 (two) times daily.   20 tablet   0     Please make return office visit for further refill ...    BP 134/72  Pulse 86  Temp(Src) 98.7 F (37.1 C)  Resp 18  Ht 6' (1.829 m)  Wt 200 lb (90.719 kg)  BMI 27.12 kg/m2  SpO2 100%  Physical Exam General: Well-developed, well-nourished male in no acute distress; appearance consistent with age of record HENT: normocephalic, atraumatic Eyes: pupils equal round and reactive to light; extraocular muscles intact Neck: Immobilized in cervical collar; trachea midline without dysphonia or crepitus; C-spine tenderness Heart: regular rate and rhythm Lungs: clear to auscultation bilaterally Chest: Nontender Abdomen: soft; nondistended; nontender; no masses or hepatosplenomegaly; bowel sounds present Back: Thoracic spine tenderness without step-off or deformity; no lumbar spine Extremities: No deformity; full range of motion; pulses normal Neurologic: Awake, alert and oriented; motor function intact in all extremities and symmetric;  no facial droop Skin: Warm and dry; about a 10 cm diameter subcutaneous mass palpated over the right lateral chest consistent with a lipoma Psychiatric: Normal mood and affect    ED Course  Procedures (including critical care time)   MDM   Nursing notes and vitals signs, including pulse oximetry, reviewed.  Summary of this visit's results, reviewed by myself:  Labs:  Results for orders placed during the hospital encounter of 02/22/13 (from the past 24 hour(s))  GLUCOSE, CAPILLARY     Status: Abnormal   Collection Time    02/22/13 12:24 AM      Result Value Range    Glucose-Capillary 302 (*) 70 - 99 mg/dL    Imaging Studies: Dg Cervical Spine Complete  02/22/2013   *RADIOLOGY REPORT*  Clinical Data: Status post fall off horse; neck pain.  CERVICAL SPINE - COMPLETE 4+ VIEW  Comparison: CT of the cervical spine performed 04/06/2012  Findings: There is no evidence of fracture or subluxation. Vertebral bodies demonstrate normal height and alignment. Intervertebral disc spaces are preserved.  Prevertebral soft tissues are within normal limits.  The provided odontoid view demonstrates no significant abnormality.  The visualized lung apices are clear.  IMPRESSION: No evidence of fracture or subluxation along the cervical spine.   Original Report Authenticated By: Tonia Ghent, M.D.   Dg Thoracic Spine 2 View  02/22/2013   *RADIOLOGY REPORT*  Clinical Data: Status post fall; upper back pain.  THORACIC SPINE - 2 VIEW  Comparison: Chest radiograph performed 09/22/2012  Findings: There is no evidence of fracture or subluxation. Vertebral bodies demonstrate normal height and alignment. Intervertebral disc spaces are preserved.  The visualized portions of both lungs are clear.  The mediastinum is unremarkable in appearance.  IMPRESSION: No evidence of fracture or subluxation along the thoracic spine.   Original Report Authenticated By: Tonia Ghent, M.D.      Hanley Seamen, MD 02/22/13 [phone]

## 2013-05-31 ENCOUNTER — Emergency Department (HOSPITAL_BASED_OUTPATIENT_CLINIC_OR_DEPARTMENT_OTHER)
Admission: EM | Admit: 2013-05-31 | Discharge: 2013-06-01 | Disposition: A | Discharge status: Home / Self Care | Payer: No Typology Code available for payment source | Attending: Emergency Medicine | Admitting: Emergency Medicine

## 2013-05-31 ENCOUNTER — Encounter (HOSPITAL_BASED_OUTPATIENT_CLINIC_OR_DEPARTMENT_OTHER): Payer: Self-pay | Admitting: Emergency Medicine

## 2013-05-31 DIAGNOSIS — E109 Type 1 diabetes mellitus without complications: Secondary | ICD-10-CM | POA: Insufficient documentation

## 2013-05-31 DIAGNOSIS — Z79899 Other long term (current) drug therapy: Secondary | ICD-10-CM | POA: Insufficient documentation

## 2013-05-31 DIAGNOSIS — R079 Chest pain, unspecified: Secondary | ICD-10-CM | POA: Insufficient documentation

## 2013-05-31 DIAGNOSIS — Z791 Long term (current) use of non-steroidal anti-inflammatories (NSAID): Secondary | ICD-10-CM | POA: Insufficient documentation

## 2013-05-31 DIAGNOSIS — Z794 Long term (current) use of insulin: Secondary | ICD-10-CM | POA: Insufficient documentation

## 2013-05-31 DIAGNOSIS — I10 Essential (primary) hypertension: Secondary | ICD-10-CM | POA: Insufficient documentation

## 2013-05-31 DIAGNOSIS — R51 Headache: Secondary | ICD-10-CM | POA: Insufficient documentation

## 2013-05-31 DIAGNOSIS — Z87448 Personal history of other diseases of urinary system: Secondary | ICD-10-CM | POA: Insufficient documentation

## 2013-05-31 DIAGNOSIS — R739 Hyperglycemia, unspecified: Secondary | ICD-10-CM

## 2013-05-31 LAB — URINE MICROSCOPIC-ADD ON

## 2013-05-31 LAB — URINALYSIS, ROUTINE W REFLEX MICROSCOPIC
Glucose, UA: >1000 mg/dL — AB
Leukocytes, UA: NEGATIVE
Protein, ur: NEGATIVE mg/dL
Urobilinogen, UA: 0.2 mg/dL (ref 0.0–1.0)
pH: 6 (ref 5.0–8.0)

## 2013-05-31 LAB — CBC WITH DIFFERENTIAL/PLATELET
Basophils Absolute: 0.1 10*3/uL (ref 0.0–0.1)
Basophils Relative: 0 % (ref 0–1)
Eosinophils Absolute: 0.3 10*3/uL (ref 0.0–0.7)
Eosinophils Relative: 2 % (ref 0–5)
Lymphocytes Relative: 33 % (ref 12–46)
MCH: 29.9 pg (ref 26.0–34.0)
MCV: 83.7 fL (ref 78.0–100.0)
Monocytes Relative: 8 % (ref 3–12)
Neutro Abs: 7.8 10*3/uL — ABNORMAL HIGH (ref 1.7–7.7)
Platelets: 252 10*3/uL (ref 150–400)
RDW: 12.9 % (ref 11.5–15.5)
WBC: 13.6 10*3/uL — ABNORMAL HIGH (ref 4.0–10.5)

## 2013-05-31 MED ORDER — SODIUM CHLORIDE 0.9 % IV SOLN
Freq: Once | INTRAVENOUS | Status: AC
  Administered 2013-05-31: via INTRAVENOUS

## 2013-05-31 MED ORDER — ACETAMINOPHEN 500 MG PO TABS
1000.0000 mg | ORAL_TABLET | Freq: Once | ORAL | Status: AC
  Administered 2013-05-31: 1000 mg via ORAL
  Filled 2013-05-31: qty 2

## 2013-05-31 MED ORDER — IBUPROFEN 800 MG PO TABS
800.0000 mg | ORAL_TABLET | Freq: Once | ORAL | Status: AC
  Administered 2013-05-31: 800 mg via ORAL
  Filled 2013-05-31: qty 1

## 2013-05-31 NOTE — ED Notes (Signed)
Yesterday pt noticed he was using bathroom more frequent, developed a severe headache and has been have indigestion that is not relieved, pt is diabetic that has not had novolog and lantus for > 1 week,
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
## 2013-06-01 LAB — COMPREHENSIVE METABOLIC PANEL
ALT: 18 U/L (ref 0–53)
AST: 16 U/L (ref 0–37)
Albumin: 3.9 g/dL (ref 3.5–5.2)
CO2: 23 mEq/L (ref 19–32)
Calcium: 9.4 mg/dL (ref 8.4–10.5)
Sodium: 136 mEq/L (ref 135–145)
Total Bilirubin: 0.2 mg/dL — ABNORMAL LOW (ref 0.3–1.2)
Total Protein: 6.7 g/dL (ref 6.0–8.3)

## 2013-06-01 MED ORDER — NAPROXEN 375 MG PO TABS: 375.0000 mg | ORAL_TABLET | Freq: Two times a day (BID) | ORAL | Status: AC

## 2013-07-26 ENCOUNTER — Encounter (HOSPITAL_BASED_OUTPATIENT_CLINIC_OR_DEPARTMENT_OTHER): Payer: Self-pay | Admitting: Emergency Medicine

## 2013-07-26 DIAGNOSIS — Y92410 Unspecified street and highway as the place of occurrence of the external cause: Secondary | ICD-10-CM | POA: Insufficient documentation

## 2013-07-26 DIAGNOSIS — Y9389 Activity, other specified: Secondary | ICD-10-CM | POA: Insufficient documentation

## 2013-07-26 DIAGNOSIS — F17200 Nicotine dependence, unspecified, uncomplicated: Secondary | ICD-10-CM | POA: Insufficient documentation

## 2013-07-26 DIAGNOSIS — Z794 Long term (current) use of insulin: Secondary | ICD-10-CM | POA: Insufficient documentation

## 2013-07-26 DIAGNOSIS — H9209 Otalgia, unspecified ear: Secondary | ICD-10-CM | POA: Insufficient documentation

## 2013-07-26 DIAGNOSIS — E109 Type 1 diabetes mellitus without complications: Secondary | ICD-10-CM | POA: Insufficient documentation

## 2013-07-26 DIAGNOSIS — S298XXA Other specified injuries of thorax, initial encounter: Secondary | ICD-10-CM | POA: Insufficient documentation

## 2013-07-26 DIAGNOSIS — H9190 Unspecified hearing loss, unspecified ear: Secondary | ICD-10-CM | POA: Insufficient documentation

## 2013-07-26 DIAGNOSIS — Z87448 Personal history of other diseases of urinary system: Secondary | ICD-10-CM | POA: Insufficient documentation

## 2013-07-26 DIAGNOSIS — Z79899 Other long term (current) drug therapy: Secondary | ICD-10-CM | POA: Insufficient documentation

## 2013-07-26 DIAGNOSIS — Z791 Long term (current) use of non-steroidal anti-inflammatories (NSAID): Secondary | ICD-10-CM | POA: Insufficient documentation
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
## 2013-07-27 ENCOUNTER — Emergency Department (HOSPITAL_BASED_OUTPATIENT_CLINIC_OR_DEPARTMENT_OTHER): Payer: Self-pay

## 2013-07-27 ENCOUNTER — Emergency Department (HOSPITAL_BASED_OUTPATIENT_CLINIC_OR_DEPARTMENT_OTHER)
Admission: EM | Admit: 2013-07-27 | Discharge: 2013-07-27 | Disposition: A | Discharge status: Home / Self Care | Payer: Self-pay | Attending: Emergency Medicine | Admitting: Emergency Medicine

## 2013-07-27 DIAGNOSIS — S298XXA Other specified injuries of thorax, initial encounter: Secondary | ICD-10-CM

## 2013-07-27 LAB — GLUCOSE, CAPILLARY: Glucose-Capillary: 288 mg/dL — ABNORMAL HIGH (ref 70–99)
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
## 2013-07-27 NOTE — ED Notes (Signed)
Pt ambulatory to xray, with steady rapid gait w/o difficulty.
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
## 2013-07-27 NOTE — ED Notes (Addendum)
C/o swelling and tenderness to R mid axillary lower ribs. Tenderness, swelling and possible light bruising noted.

## 2013-09-24 ENCOUNTER — Other Ambulatory Visit: Payer: Self-pay | Admitting: Internal Medicine

## 2013-09-27 NOTE — Telephone Encounter (Signed)
Please advise refill? 

## 2013-09-27 NOTE — Telephone Encounter (Signed)
Needs OV.  

## 2013-09-29 NOTE — Telephone Encounter (Signed)
Patient informed would need to schedule OV for refill.  Patient agreed to do so.

## 2014-01-02 ENCOUNTER — Emergency Department (HOSPITAL_BASED_OUTPATIENT_CLINIC_OR_DEPARTMENT_OTHER): Payer: No Typology Code available for payment source

## 2014-01-02 ENCOUNTER — Emergency Department (HOSPITAL_BASED_OUTPATIENT_CLINIC_OR_DEPARTMENT_OTHER)
Admission: EM | Admit: 2014-01-02 | Discharge: 2014-01-02 | Disposition: A | Discharge status: Home / Self Care | Payer: No Typology Code available for payment source | Attending: Emergency Medicine | Admitting: Emergency Medicine

## 2014-01-02 ENCOUNTER — Encounter (HOSPITAL_BASED_OUTPATIENT_CLINIC_OR_DEPARTMENT_OTHER): Payer: Self-pay | Admitting: Emergency Medicine

## 2014-01-02 DIAGNOSIS — Z79899 Other long term (current) drug therapy: Secondary | ICD-10-CM | POA: Insufficient documentation

## 2014-01-02 DIAGNOSIS — S0990XA Unspecified injury of head, initial encounter: Secondary | ICD-10-CM | POA: Diagnosis present

## 2014-01-02 DIAGNOSIS — Z87448 Personal history of other diseases of urinary system: Secondary | ICD-10-CM | POA: Insufficient documentation

## 2014-01-02 DIAGNOSIS — E119 Type 2 diabetes mellitus without complications: Secondary | ICD-10-CM | POA: Insufficient documentation

## 2014-01-02 DIAGNOSIS — Y92410 Unspecified street and highway as the place of occurrence of the external cause: Secondary | ICD-10-CM | POA: Diagnosis not present

## 2014-01-02 DIAGNOSIS — IMO0002 Reserved for concepts with insufficient information to code with codable children: Secondary | ICD-10-CM | POA: Insufficient documentation

## 2014-01-02 DIAGNOSIS — Y9389 Activity, other specified: Secondary | ICD-10-CM | POA: Diagnosis not present

## 2014-01-02 DIAGNOSIS — Z794 Long term (current) use of insulin: Secondary | ICD-10-CM | POA: Diagnosis not present

## 2014-01-02 DIAGNOSIS — F17200 Nicotine dependence, unspecified, uncomplicated: Secondary | ICD-10-CM | POA: Diagnosis not present

## 2014-01-02 DIAGNOSIS — Z791 Long term (current) use of non-steroidal anti-inflammatories (NSAID): Secondary | ICD-10-CM | POA: Insufficient documentation

## 2014-01-02 DIAGNOSIS — T148XXA Other injury of unspecified body region, initial encounter: Secondary | ICD-10-CM

## 2014-01-02 MED ORDER — METHOCARBAMOL 500 MG PO TABS: 500.0000 mg | ORAL_TABLET | Freq: Two times a day (BID) | ORAL | Status: AC

## 2014-01-02 MED ORDER — TRAMADOL HCL 50 MG PO TABS: 50.0000 mg | ORAL_TABLET | Freq: Four times a day (QID) | ORAL | Status: AC | PRN

## 2014-01-02 MED ORDER — KETOROLAC TROMETHAMINE 60 MG/2ML IM SOLN
60.0000 mg | Freq: Once | INTRAMUSCULAR | Status: AC
  Administered 2014-01-02: 60 mg via INTRAMUSCULAR
  Filled 2014-01-02: qty 2

## 2014-01-02 MED ORDER — MELOXICAM 7.5 MG PO TABS: 7.5000 mg | ORAL_TABLET | Freq: Every day | ORAL | Status: DC

## 2014-01-02 MED ORDER — METHOCARBAMOL 500 MG PO TABS
1000.0000 mg | ORAL_TABLET | Freq: Once | ORAL | Status: AC
  Administered 2014-01-02: 1000 mg via ORAL
  Filled 2014-01-02: qty 2
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
## 2014-01-02 NOTE — Discharge Instructions (Signed)
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
## 2014-01-18 ENCOUNTER — Telehealth (HOSPITAL_BASED_OUTPATIENT_CLINIC_OR_DEPARTMENT_OTHER): Payer: Self-pay | Admitting: *Deleted
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
## 2017-04-03 ENCOUNTER — Encounter (HOSPITAL_BASED_OUTPATIENT_CLINIC_OR_DEPARTMENT_OTHER): Payer: Self-pay | Admitting: *Deleted

## 2017-04-03 ENCOUNTER — Emergency Department (HOSPITAL_BASED_OUTPATIENT_CLINIC_OR_DEPARTMENT_OTHER): Payer: Self-pay

## 2017-04-03 ENCOUNTER — Emergency Department (HOSPITAL_BASED_OUTPATIENT_CLINIC_OR_DEPARTMENT_OTHER)
Admission: EM | Admit: 2017-04-03 | Discharge: 2017-04-04 | Disposition: A | Discharge status: Home / Self Care | Payer: Self-pay | Attending: Emergency Medicine | Admitting: Emergency Medicine

## 2017-04-03 DIAGNOSIS — E109 Type 1 diabetes mellitus without complications: Secondary | ICD-10-CM | POA: Insufficient documentation

## 2017-04-03 DIAGNOSIS — Y929 Unspecified place or not applicable: Secondary | ICD-10-CM | POA: Insufficient documentation

## 2017-04-03 DIAGNOSIS — Z79899 Other long term (current) drug therapy: Secondary | ICD-10-CM | POA: Insufficient documentation

## 2017-04-03 DIAGNOSIS — Z23 Encounter for immunization: Secondary | ICD-10-CM | POA: Insufficient documentation

## 2017-04-03 DIAGNOSIS — Y939 Activity, unspecified: Secondary | ICD-10-CM | POA: Insufficient documentation

## 2017-04-03 DIAGNOSIS — Y999 Unspecified external cause status: Secondary | ICD-10-CM | POA: Insufficient documentation

## 2017-04-03 DIAGNOSIS — R040 Epistaxis: Secondary | ICD-10-CM | POA: Insufficient documentation

## 2017-04-03 DIAGNOSIS — S022XXA Fracture of nasal bones, initial encounter for closed fracture: Secondary | ICD-10-CM | POA: Insufficient documentation

## 2017-04-03 HISTORY — DX: Patient's noncompliance with other medical treatment and regimen due to unspecified reason: Z91.199

## 2017-04-03 HISTORY — DX: Patient's noncompliance with other medical treatment and regimen: Z91.19

## 2017-04-03 MED ORDER — OXYMETAZOLINE HCL 0.05 % NA SOLN
1.0000 | Freq: Two times a day (BID) | NASAL | Status: DC
  Administered 2017-04-03: 1 via NASAL
  Filled 2017-04-03: qty 15

## 2017-04-03 MED ORDER — ACETAMINOPHEN 500 MG PO TABS
1000.0000 mg | ORAL_TABLET | Freq: Once | ORAL | Status: AC
  Administered 2017-04-03: 1000 mg via ORAL
  Filled 2017-04-03: qty 2

## 2017-04-03 MED ORDER — TETANUS-DIPHTH-ACELL PERTUSSIS 5-2.5-18.5 LF-MCG/0.5 IM SUSP
0.5000 mL | Freq: Once | INTRAMUSCULAR | Status: AC
  Administered 2017-04-03: 0.5 mL via INTRAMUSCULAR
  Filled 2017-04-03: qty 0.5

## 2017-04-03 MED ORDER — AMOXICILLIN-POT CLAVULANATE 875-125 MG PO TABS
1.0000 | ORAL_TABLET | Freq: Once | ORAL | Status: AC
  Administered 2017-04-03: 1 via ORAL
  Filled 2017-04-03: qty 1
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
## 2017-04-04 ENCOUNTER — Encounter (HOSPITAL_BASED_OUTPATIENT_CLINIC_OR_DEPARTMENT_OTHER): Payer: Self-pay | Admitting: Emergency Medicine

## 2017-04-04 MED ORDER — NAPROXEN 250 MG PO TABS
500.0000 mg | ORAL_TABLET | Freq: Once | ORAL | Status: AC
  Administered 2017-04-04: 500 mg via ORAL
  Filled 2017-04-04: qty 2

## 2017-04-04 MED ORDER — AMOXICILLIN-POT CLAVULANATE 875-125 MG PO TABS: 1.0000 | ORAL_TABLET | Freq: Two times a day (BID) | ORAL | 0 refills | Status: AC

## 2017-04-04 MED ORDER — SILVER NITRATE-POT NITRATE 75-25 % EX MISC
1.0000 | Freq: Once | CUTANEOUS | Status: AC
  Administered 2017-04-04: 1 via TOPICAL
  Filled 2017-04-04: qty 1

## 2017-04-04 MED ORDER — MELOXICAM 15 MG PO TABS: 15.0000 mg | ORAL_TABLET | Freq: Every day | ORAL | 0 refills | Status: AC

## 2017-04-04 NOTE — ED Notes (Signed)
Pt verbalizes understanding of d/c instructions and denies any further needs at this time. 

## 2021-09-19 ENCOUNTER — Other Ambulatory Visit: Payer: Self-pay

## 2021-09-19 ENCOUNTER — Emergency Department (HOSPITAL_COMMUNITY)
Admission: EM | Admit: 2021-09-19 | Discharge: 2021-09-19 | Disposition: A | Discharge status: Home / Self Care | Payer: Self-pay | Attending: Emergency Medicine | Admitting: Emergency Medicine

## 2021-09-19 ENCOUNTER — Encounter (HOSPITAL_COMMUNITY): Payer: Self-pay

## 2021-09-19 DIAGNOSIS — M549 Dorsalgia, unspecified: Secondary | ICD-10-CM

## 2021-09-19 DIAGNOSIS — Y92410 Unspecified street and highway as the place of occurrence of the external cause: Secondary | ICD-10-CM | POA: Insufficient documentation

## 2021-09-19 DIAGNOSIS — Z794 Long term (current) use of insulin: Secondary | ICD-10-CM | POA: Insufficient documentation

## 2021-09-19 DIAGNOSIS — K610 Anal abscess: Secondary | ICD-10-CM

## 2021-09-19 MED ORDER — OXYCODONE-ACETAMINOPHEN 5-325 MG PO TABS
1.0000 | ORAL_TABLET | Freq: Once | ORAL | Status: AC
  Administered 2021-09-19: 1 via ORAL
  Filled 2021-09-19: qty 1

## 2021-09-19 MED ORDER — DOXYCYCLINE HYCLATE 100 MG PO CAPS: 100.0000 mg | ORAL_CAPSULE | Freq: Two times a day (BID) | ORAL | 0 refills | Status: AC

## 2021-09-19 MED ORDER — CYCLOBENZAPRINE HCL 10 MG PO TABS: 10.0000 mg | ORAL_TABLET | Freq: Every evening | ORAL | 0 refills | Status: AC | PRN

## 2021-09-19 NOTE — ED Triage Notes (Signed)
Patient arrives from home with complaint of back pain that began today. Pt states he also has an abscess in his buttocks that he noticed today, pt states it popped and is draining.
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
## 2021-09-19 NOTE — Discharge Instructions (Addendum)
I am prescribing you a strong muscle relaxer called flexeril. Please only take this medication once in the evening with dinner. This medication can make you quite drowsy. Do not mix it with alcohol. Do not drive a vehicle after taking it.   I am also prescribing you an antibiotic called doxycycline.  Please take this twice a day for the next 10 days.  Do not stop taking this early even if you feel your symptoms have resolved.  If you develop any new or worsening symptoms please come back to the emergency department immediately.
